# Patient Record
Sex: Female | Born: 1945 | Race: Black or African American | Hispanic: No | Smoking: Never smoker
Health system: Southern US, Community
[De-identification: ages and names within clinical notes are randomized; demographics above are authoritative.]

## PROBLEM LIST (undated history)

## (undated) DIAGNOSIS — E079 Disorder of thyroid, unspecified: Secondary | ICD-10-CM

## (undated) DIAGNOSIS — I10 Essential (primary) hypertension: Secondary | ICD-10-CM

## (undated) DIAGNOSIS — E119 Type 2 diabetes mellitus without complications: Secondary | ICD-10-CM

## (undated) HISTORY — PX: PARATHYROIDECTOMY: SHX19

---

## 2007-09-27 ENCOUNTER — Other Ambulatory Visit: Admission: RE | Admit: 2007-09-27 | Discharge: 2007-09-27 | Payer: Self-pay | Admitting: Family Medicine

## 2008-08-22 ENCOUNTER — Encounter: Admission: RE | Admit: 2008-08-22 | Discharge: 2008-08-29 | Payer: Self-pay | Admitting: Family Medicine

## 2009-05-21 ENCOUNTER — Encounter (HOSPITAL_COMMUNITY): Admission: RE | Admit: 2009-05-21 | Discharge: 2009-07-10 | Payer: Self-pay | Admitting: Family Medicine

## 2009-06-04 ENCOUNTER — Encounter: Admission: RE | Admit: 2009-06-04 | Discharge: 2009-06-04 | Payer: Self-pay | Admitting: Surgery

## 2009-07-29 ENCOUNTER — Ambulatory Visit (HOSPITAL_COMMUNITY): Admission: RE | Admit: 2009-07-29 | Discharge: 2009-07-30 | Payer: Self-pay | Admitting: Surgery

## 2009-07-29 ENCOUNTER — Encounter (INDEPENDENT_AMBULATORY_CARE_PROVIDER_SITE_OTHER): Payer: Self-pay | Admitting: Surgery

## 2010-05-26 LAB — URINALYSIS, ROUTINE W REFLEX MICROSCOPIC
Bilirubin Urine: NEGATIVE
Ketones, ur: NEGATIVE mg/dL
Protein, ur: NEGATIVE mg/dL
Specific Gravity, Urine: 1.013 (ref 1.005–1.030)
Urobilinogen, UA: 0.2 mg/dL (ref 0.0–1.0)

## 2010-05-26 LAB — CBC
MCHC: 33.5 g/dL (ref 30.0–36.0)
RBC: 4.6 MIL/uL (ref 3.87–5.11)
WBC: 5.9 10*3/uL (ref 4.0–10.5)

## 2010-05-26 LAB — CALCIUM
Calcium: 10.2 mg/dL (ref 8.4–10.5)
Calcium: 10.4 mg/dL (ref 8.4–10.5)

## 2010-05-26 LAB — BASIC METABOLIC PANEL
Chloride: 110 mEq/L (ref 96–112)
Glucose, Bld: 107 mg/dL — ABNORMAL HIGH (ref 70–99)

## 2010-05-26 LAB — PROTIME-INR: Prothrombin Time: 12.7 seconds (ref 11.6–15.2)

## 2010-05-26 LAB — DIFFERENTIAL
Basophils Absolute: 0 10*3/uL (ref 0.0–0.1)
Neutro Abs: 3.6 10*3/uL (ref 1.7–7.7)
Neutrophils Relative %: 61 % (ref 43–77)

## 2010-12-16 ENCOUNTER — Ambulatory Visit (INDEPENDENT_AMBULATORY_CARE_PROVIDER_SITE_OTHER): Payer: Self-pay | Admitting: Surgery

## 2011-06-26 IMAGING — NM NM OCTREOTIDE LOCALIZATION
1 series · 6 of 6 positions shown · non-contrast
Comparison: None.

CLINICAL DATA: Hyperparathyroidism.  Serum PTH 87.

NM PARATHYROID SCINTIGRAPHY AND SPECT IMAGING
TECHNIQUE: Following intravenous administration of
radiopharmaceutical, early and 2-hour delayed planar images were
obtained in the anterior projection.  Delayed triplanar SPECT
images were also obtained at 2 hours.
Radiopharmaceutical:  26.0 mCi Fc-00m Sestamibi IV

[parathyroid · 4.66mm/px · 6 of 128 frames shown]
[frame 11/128]
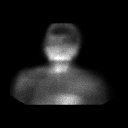
[frame 32/128]
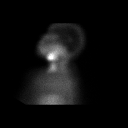
[frame 54/128]
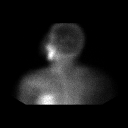
[frame 75/128]
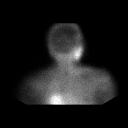
[frame 96/128]
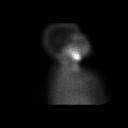
[frame 118/128]
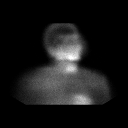

[6 of 6 positions shown; findings below may reference images not displayed]

FINDINGS: The initial planar images demonstrate asymmetrically
increased activity throughout the left thyroid lobe.  Most of this
activity persists on the delayed images.  There is relative washout
of activity from the right thyroid lobe.  No persistent focal
abnormal activity is identified.

SPECT imaging confirms the persistence of a large area of delayed
activity in the inferior left paratracheal region.  There is
probably some residual thyroid activity inferiorly on the right.
No focal activity typical of a parathyroid adenoma is identified.
There is no abnormal activity within the superior mediastinum.
IMPRESSION: 1.  Large area of persistent delayed activity throughout the left
paratracheal region is not typical for a parathyroid adenoma and
may reflect diffuse asymmetric thyroid activity.
2.  Anatomic correlation with neck CT or MRI is suggested.

## 2012-12-16 ENCOUNTER — Emergency Department (HOSPITAL_BASED_OUTPATIENT_CLINIC_OR_DEPARTMENT_OTHER): Payer: Medicare Other

## 2012-12-16 ENCOUNTER — Emergency Department (HOSPITAL_BASED_OUTPATIENT_CLINIC_OR_DEPARTMENT_OTHER)
Admission: EM | Admit: 2012-12-16 | Discharge: 2012-12-17 | Disposition: A | Discharge status: Home / Self Care | Payer: Medicare Other | Attending: Emergency Medicine | Admitting: Emergency Medicine

## 2012-12-16 ENCOUNTER — Encounter (HOSPITAL_BASED_OUTPATIENT_CLINIC_OR_DEPARTMENT_OTHER): Payer: Self-pay | Admitting: Emergency Medicine

## 2012-12-16 DIAGNOSIS — R0982 Postnasal drip: Secondary | ICD-10-CM

## 2012-12-16 DIAGNOSIS — I10 Essential (primary) hypertension: Secondary | ICD-10-CM | POA: Insufficient documentation

## 2012-12-16 DIAGNOSIS — Z79899 Other long term (current) drug therapy: Secondary | ICD-10-CM | POA: Insufficient documentation

## 2012-12-16 HISTORY — DX: Essential (primary) hypertension: I10

## 2012-12-16 MED ORDER — GUAIFENESIN 200 MG PO TABS: 400.0000 mg | ORAL_TABLET | ORAL | Status: AC | PRN

## 2012-12-16 MED ORDER — FLUTICASONE PROPIONATE 50 MCG/ACT NA SUSP: 2.0000 | Freq: Every day | NASAL | Status: AC

## 2012-12-16 MED ORDER — BENZONATATE 100 MG PO CAPS: 100.0000 mg | ORAL_CAPSULE | Freq: Three times a day (TID) | ORAL | Status: AC

## 2012-12-16 MED ORDER — BENZONATATE 100 MG PO CAPS
100.0000 mg | ORAL_CAPSULE | Freq: Once | ORAL | Status: AC
Start: 2012-12-17 — End: 2012-12-16
  Administered 2012-12-16: 100 mg via ORAL
  Filled 2012-12-16: qty 1

## 2012-12-16 MED ORDER — DESLORATADINE 5 MG PO TBDP: 5.0000 mg | ORAL_TABLET | Freq: Every day | ORAL | Status: AC

## 2012-12-16 NOTE — ED Notes (Addendum)
Patient with cough since Monday.  States that she is now coughing up greenish yellowish sputum.  Patient's voice is hoarse.  Respiratory intact.

## 2012-12-16 NOTE — ED Provider Notes (Signed)
CSN: [id number]     Arrival date & time 12/16/12  2249 History   First MD Initiated Contact with Patient 12/16/12 2302    Scribed for Eduin Friedel Smitty Cords, MD, the patient was seen in room MH07/MH07. This chart was scribed by Lewanda Rife, ED scribe. Patient's care was started at 11:18 PM  Chief Complaint  Patient presents with  . Cough   (Consider location/radiation/quality/duration/timing/severity/associated sxs/prior Treatment) Patient is a 67 y.o. female presenting with cough. The history is provided by the patient. No language interpreter was used.  Cough Cough characteristics:  Non-productive Severity:  Moderate Onset quality:  Gradual Timing:  Intermittent Progression:  Unchanged Chronicity:  Recurrent Smoker: no   Context: upper respiratory infection   Context: not occupational exposure   Relieved by:  Nothing Worsened by:  Nothing tried Associated symptoms: ear pain   Associated symptoms: no fever and no wheezing    HPI Comments: Nancy Tucker is a 67 y.o. female who presents to the Emergency Department complaining of intermittent cough onset 5 days. Reports associated hoarse voice. Denies associated fever, dysphagia, and shortness of breath. Reports symptoms are exacerbated at night and alleviated by nothing. Denies sick contacts.  Past Medical History  Diagnosis Date  . Hypertension    History reviewed. No pertinent past surgical history. No family history on file. History  Substance Use Topics  . Smoking status: Never Smoker   . Smokeless tobacco: Not on file  . Alcohol Use: No   OB History   Grav Para Term Preterm Abortions TAB SAB Ect Mult Living                 Review of Systems  Constitutional: Negative for fever.  HENT: Positive for ear pain.   Respiratory: Positive for cough. Negative for wheezing.   All other systems reviewed and are negative.   A complete 10 system review of systems was obtained and all systems are negative except as  noted in the HPI and PMHx.    Allergies  Review of patient's allergies indicates no known allergies.  Home Medications   Current Outpatient Rx  Name  Route  Sig  Dispense  Refill  . lisinopril (PRINIVIL,ZESTRIL) 40 MG tablet   Oral   Take 40 mg by mouth daily.          BP 175/104  Pulse 84  Temp(Src) 100.3 F (37.9 C) (Oral)  Resp 20  Ht 5\' 4"  (1.626 m)  Wt 196 lb (88.905 kg)  BMI 33.63 kg/m2  SpO2 99% Physical Exam  Nursing note and vitals reviewed. Constitutional: She is oriented to person, place, and time. She appears well-developed and well-nourished. No distress.  HENT:  Head: Normocephalic and atraumatic.  Right Ear: Tympanic membrane, external ear and ear canal normal.  Left Ear: Tympanic membrane, external ear and ear canal normal.  Mouth/Throat: No oropharyngeal exudate.  Cobblestoning c/w post nasal drip   Eyes: Conjunctivae and EOM are normal. Pupils are equal, round, and reactive to light.  Neck: Normal range of motion. Neck supple. No tracheal deviation present.  Cardiovascular: Normal rate, regular rhythm and intact distal pulses.   No murmur heard. Pulmonary/Chest: Effort normal and breath sounds normal. No stridor. No respiratory distress. She has no wheezes. She has no rales.  Abdominal: Soft. Bowel sounds are normal. There is no tenderness. There is no rebound.  Musculoskeletal: Normal range of motion.  Neurological: She is alert and oriented to person, place, and time. She has normal reflexes.  Skin: Skin  is warm and dry.  Psychiatric: She has a normal mood and affect. Her behavior is normal.    ED Course  Procedures (including critical care time) DIAGNOSTIC STUDIES: Oxygen Saturation is 99% on room air,  normal by my interpretation.    COORDINATION OF CARE:  Nursing notes reviewed. Vital signs reviewed. Initial pt interview and examination performed.   11:23 PM-Discussed work up plan with pt at bedside, which includes CXR . Pt agrees with  plan.  11:53 PM Nursing Notes Reviewed/ Care Coordinated Applicable Imaging Reviewed  Interpretation of Laboratory Data incorporated into ED treatment Discussed results and treatment plan with pt. Pt demonstrates understanding and agrees with plan.  Treatment plan initiated: Medications  benzonatate (TESSALON) capsule 100 mg (not administered)     Initial diagnostic testing ordered.    Labs Review Labs Reviewed - No data to display Imaging Review No results found.  EKG Interpretation   None       MDM  No diagnosis found.  HENT c/w with post nasal drip. No wheezes or rhonchi on exam. Chest x-ray negative.  Will treat for same  I personally performed the services described in this documentation, which was scribed in my presence. The recorded information has been reviewed and is accurate.     Jasmine Awe, MD 12/17/12 0145

## 2015-10-14 ENCOUNTER — Emergency Department (HOSPITAL_BASED_OUTPATIENT_CLINIC_OR_DEPARTMENT_OTHER): Payer: Medicare Other

## 2015-10-14 ENCOUNTER — Emergency Department (HOSPITAL_BASED_OUTPATIENT_CLINIC_OR_DEPARTMENT_OTHER)
Admission: EM | Admit: 2015-10-14 | Discharge: 2015-10-14 | Disposition: A | Discharge status: Home / Self Care | Payer: Medicare Other | Attending: Emergency Medicine | Admitting: Emergency Medicine

## 2015-10-14 ENCOUNTER — Encounter (HOSPITAL_BASED_OUTPATIENT_CLINIC_OR_DEPARTMENT_OTHER): Payer: Self-pay | Admitting: Emergency Medicine

## 2015-10-14 DIAGNOSIS — Y999 Unspecified external cause status: Secondary | ICD-10-CM | POA: Insufficient documentation

## 2015-10-14 DIAGNOSIS — W1839XA Other fall on same level, initial encounter: Secondary | ICD-10-CM | POA: Diagnosis not present

## 2015-10-14 DIAGNOSIS — S6992XA Unspecified injury of left wrist, hand and finger(s), initial encounter: Secondary | ICD-10-CM | POA: Diagnosis present

## 2015-10-14 DIAGNOSIS — Y929 Unspecified place or not applicable: Secondary | ICD-10-CM | POA: Diagnosis not present

## 2015-10-14 DIAGNOSIS — S8392XA Sprain of unspecified site of left knee, initial encounter: Secondary | ICD-10-CM | POA: Diagnosis not present

## 2015-10-14 DIAGNOSIS — S62112A Displaced fracture of triquetrum [cuneiform] bone, left wrist, initial encounter for closed fracture: Secondary | ICD-10-CM

## 2015-10-14 DIAGNOSIS — Y9301 Activity, walking, marching and hiking: Secondary | ICD-10-CM | POA: Insufficient documentation

## 2015-10-14 DIAGNOSIS — W19XXXA Unspecified fall, initial encounter: Secondary | ICD-10-CM

## 2015-10-14 DIAGNOSIS — E119 Type 2 diabetes mellitus without complications: Secondary | ICD-10-CM | POA: Diagnosis not present

## 2015-10-14 DIAGNOSIS — I10 Essential (primary) hypertension: Secondary | ICD-10-CM | POA: Insufficient documentation

## 2015-10-14 DIAGNOSIS — Z79899 Other long term (current) drug therapy: Secondary | ICD-10-CM | POA: Diagnosis not present

## 2015-10-14 HISTORY — DX: Type 2 diabetes mellitus without complications: E11.9

## 2015-10-14 HISTORY — DX: Disorder of thyroid, unspecified: E07.9

## 2015-10-14 NOTE — Discharge Instructions (Signed)
Wear splint as applied for the next week.  Follow-up with hand surgery in the next week. The contact information for Dr. Melvyn Novasrtmann has been provided in this discharge summary. Please call to make these arrangements.  Ice for 20 minutes every 2 hours while awake for the next 2 days.

## 2015-10-14 NOTE — ED Notes (Signed)
Patient transported to X-ray 

## 2015-10-14 NOTE — ED Notes (Signed)
MD at bedside. 

## 2015-10-14 NOTE — ED Triage Notes (Signed)
Patient states that she fell earlier today and now has multiple areas that hurt. The patient reports that she is having the most pain in her left knee

## 2015-10-14 NOTE — ED Provider Notes (Addendum)
[facility]   CSN: [id number] Arrival date & time: 10/14/15  1215  First Provider Contact:  None       History   Chief Complaint Chief Complaint  Patient presents with  . Fall    HPI Nancy Tucker is a 70 y.o. female.  Patient is a 70 year old female with history of diabetes and hypertension. She presents for evaluation after a fall. She is walking out to the mailbox when she lost her balance and fell injuring her left wrist, left knee, and tailbone. She has had difficulty ambulating secondary to pain.   The history is provided by the patient.  Fall  This is a new problem. The current episode started less than 1 hour ago. The problem occurs constantly. The problem has not changed since onset.Pertinent negatives include no chest pain, no abdominal pain, no headaches and no shortness of breath. Exacerbated by: Movement and palpation. The symptoms are relieved by rest. She has tried nothing for the symptoms.    Past Medical History:  Diagnosis Date  . Diabetes mellitus without complication (HCC)   . Hypertension   . Thyroid disease     There are no active problems to display for this patient.   Past Surgical History:  Procedure Laterality Date  . PARATHYROIDECTOMY      OB History    No data available       Home Medications    Prior to Admission medications   Medication Sig Start Date End Date Taking? Authorizing Provider  benzonatate (TESSALON) 100 MG capsule Take 1 capsule (100 mg total) by mouth every 8 (eight) hours. 12/16/12   April Palumbo, MD  desloratadine (CLARINEX REDITAB) 5 MG disintegrating tablet Take 1 tablet (5 mg total) by mouth daily. 12/16/12   April Palumbo, MD  fluticasone [facility](FLONASE) 50 MCG/ACT nasal spray Place 2 sprays into the nose daily. 12/16/12   April Palumbo, MD  guaiFENesin 200 MG tablet Take 2 tablets (400 mg total) by mouth every 4 (four) hours as needed for congestion. 12/16/12   April Palumbo, MD   lisinopril (PRINIVIL,ZESTRIL) 40 MG tablet Take 40 mg by mouth daily.    Historical Provider, MD    Family History History reviewed. No pertinent family history.  Social History Social History  Substance Use Topics  . Smoking status: Never Smoker  . Smokeless tobacco: Never Used  . Alcohol use No     Allergies   Sulfa antibiotics   Review of Systems Review of Systems  Respiratory: Negative for shortness of breath.   Cardiovascular: Negative for chest pain.  Gastrointestinal: Negative for abdominal pain.  Neurological: Negative for headaches.  All other systems reviewed and are negative.    Physical Exam Updated Vital Signs BP 173/97 (BP Location: Left Arm)   Pulse 71   Temp 98.2 F (36.8 C) (Oral)   Resp 18   Ht 5\' 4"  (1.626 m)   Wt 206 lb (93.4 kg)   SpO2 95%   BMI 35.36 kg/m   Physical Exam  Constitutional: She is oriented to person, place, and time. She appears well-developed and well-nourished. No distress.  HENT:  Head: Normocephalic and atraumatic.  Neck: Normal range of motion. Neck supple.  Cardiovascular: Normal rate and regular rhythm.  Exam reveals no gallop and no friction rub.   No murmur heard. Pulmonary/Chest: Effort normal and breath sounds normal. No respiratory distress. She has no wheezes.  Abdominal: Soft. Bowel sounds are normal. She exhibits no distension. There is no  tenderness.  Musculoskeletal: Normal range of motion.  There is tenderness to palpation of the left wrist with no obvious deformity. She has good range of motion of the knee. Distal PMS is intact.  Neurological: She is alert and oriented to person, place, and time.  Skin: Skin is warm and dry. She is not diaphoretic.  Nursing note and vitals reviewed.    ED Treatments / Results  Labs (all labs ordered are listed, but only abnormal results are displayed) Labs Reviewed - No data to display  EKG  EKG Interpretation None       Radiology No results  found.  Procedures Procedures (including critical care time)  Medications Ordered in ED Medications - No data to display   Initial Impression / Assessment and Plan / ED Course  I have reviewed the triage vital signs and the nursing notes.  Pertinent labs & imaging results that were available during my care of the patient were reviewed by me and considered in my medical decision making (see chart for details).  Clinical Course     Final Clinical Impressions(s) / ED Diagnoses   Final diagnoses:  None   X-rays of the knee and coccyx are negative for fracture. She does have what appears to be a triquetral avulsion fracture of the left wrist. This will be placed into a Velcro splint and she will be advised to follow-up with hand surgery in the next week.  New Prescriptions New Prescriptions   No medications on file     Geoffery Lyons, MD 10/14/15 1505    Geoffery Lyons, MD 11/27/15 1610    Geoffery Lyons, MD 01/20/16 9604
